# Patient Record
Sex: Female | Born: 1980 | Race: Black or African American | Hispanic: No | Marital: Single | Smoking: Current every day smoker
Health system: Southern US, Community
[De-identification: ages and names within clinical notes are randomized; demographics above are authoritative.]

## PROBLEM LIST (undated history)

## (undated) DIAGNOSIS — D649 Anemia, unspecified: Secondary | ICD-10-CM

## (undated) DIAGNOSIS — O139 Gestational [pregnancy-induced] hypertension without significant proteinuria, unspecified trimester: Secondary | ICD-10-CM

## (undated) HISTORY — DX: Anemia, unspecified: D64.9

---

## 2004-08-09 HISTORY — PX: ANKLE SURGERY: SHX546

## 2004-08-09 HISTORY — PX: OTHER SURGICAL HISTORY: SHX169

## 2005-07-09 ENCOUNTER — Ambulatory Visit: Payer: Self-pay | Admitting: Internal Medicine

## 2005-07-09 ENCOUNTER — Inpatient Hospital Stay (HOSPITAL_COMMUNITY): Admission: EM | Admit: 2005-07-09 | Discharge: 2005-07-14 | Payer: Self-pay | Admitting: Emergency Medicine

## 2005-08-11 ENCOUNTER — Encounter: Admission: RE | Admit: 2005-08-11 | Discharge: 2005-10-15 | Payer: Self-pay | Admitting: Orthopaedic Surgery

## 2009-07-24 ENCOUNTER — Inpatient Hospital Stay (HOSPITAL_COMMUNITY): Admission: AD | Admit: 2009-07-24 | Discharge: 2009-07-28 | Payer: Self-pay | Admitting: Obstetrics and Gynecology

## 2009-08-05 ENCOUNTER — Inpatient Hospital Stay (HOSPITAL_COMMUNITY): Admission: AD | Admit: 2009-08-05 | Discharge: 2009-08-05 | Payer: Self-pay | Admitting: Obstetrics and Gynecology

## 2009-12-26 ENCOUNTER — Encounter: Admission: RE | Admit: 2009-12-26 | Discharge: 2009-12-26 | Payer: Self-pay | Admitting: Obstetrics and Gynecology

## 2010-11-09 LAB — COMPREHENSIVE METABOLIC PANEL
ALT: 32 U/L (ref 0–35)
AST: 17 U/L (ref 0–37)
AST: 19 U/L (ref 0–37)
Albumin: 2.8 g/dL — ABNORMAL LOW (ref 3.5–5.2)
Albumin: 3.1 g/dL — ABNORMAL LOW (ref 3.5–5.2)
Alkaline Phosphatase: 114 U/L (ref 39–117)
Alkaline Phosphatase: 72 U/L (ref 39–117)
BUN: 13 mg/dL (ref 6–23)
CO2: 23 mEq/L (ref 19–32)
CO2: 25 mEq/L (ref 19–32)
Calcium: 9.1 mg/dL (ref 8.4–10.5)
Chloride: 105 mEq/L (ref 96–112)
Chloride: 108 mEq/L (ref 96–112)
Creatinine, Ser: 0.66 mg/dL (ref 0.4–1.2)
Creatinine, Ser: 0.99 mg/dL (ref 0.4–1.2)
GFR calc Af Amer: >60 mL/min (ref >60)
GFR calc Af Amer: >60 mL/min (ref >60)
GFR calc non Af Amer: >60 mL/min (ref >60)
GFR calc non Af Amer: >60 mL/min (ref >60)
Glucose, Bld: 80 mg/dL (ref 70–99)
Potassium: 3.9 mEq/L (ref 3.5–5.1)
Potassium: 4.1 mEq/L (ref 3.5–5.1)
Sodium: 140 mEq/L (ref 135–145)
Total Bilirubin: 0.5 mg/dL (ref 0.3–1.2)
Total Bilirubin: 0.6 mg/dL (ref 0.3–1.2)
Total Protein: 6.4 g/dL (ref 6.0–8.3)

## 2010-11-09 LAB — CBC
HCT: 22.4 % — ABNORMAL LOW (ref 36.0–46.0)
HCT: 25.9 % — ABNORMAL LOW (ref 36.0–46.0)
HCT: 32 % — ABNORMAL LOW (ref 36.0–46.0)
Hemoglobin: 10.8 g/dL — ABNORMAL LOW (ref 12.0–15.0)
Hemoglobin: 7.7 g/dL — ABNORMAL LOW (ref 12.0–15.0)
Hemoglobin: 8.7 g/dL — ABNORMAL LOW (ref 12.0–15.0)
MCHC: 33.4 g/dL (ref 30.0–36.0)
MCHC: 33.9 g/dL (ref 30.0–36.0)
MCHC: 34.2 g/dL (ref 30.0–36.0)
MCV: 91.8 fL (ref 78.0–100.0)
MCV: 91.9 fL (ref 78.0–100.0)
MCV: 93 fL (ref 78.0–100.0)
Platelets: 120 10*3/uL — ABNORMAL LOW (ref 150–400)
Platelets: 178 10*3/uL (ref 150–400)
Platelets: 224 10*3/uL (ref 150–400)
RBC: 2.82 MIL/uL — ABNORMAL LOW (ref 3.87–5.11)
RBC: 3.48 MIL/uL — ABNORMAL LOW (ref 3.87–5.11)
RDW: 14.5 % (ref 11.5–15.5)
RDW: 14.9 % (ref 11.5–15.5)
RDW: 15.2 % (ref 11.5–15.5)
WBC: 7.4 10*3/uL (ref 4.0–10.5)
WBC: 8.3 10*3/uL (ref 4.0–10.5)

## 2010-11-09 LAB — URINALYSIS, ROUTINE W REFLEX MICROSCOPIC
Bilirubin Urine: NEGATIVE
Ketones, ur: NEGATIVE mg/dL
Nitrite: NEGATIVE
Protein, ur: NEGATIVE mg/dL
Specific Gravity, Urine: 1.015 (ref 1.005–1.030)
Urobilinogen, UA: 0.2 mg/dL (ref 0.0–1.0)

## 2010-11-09 LAB — RPR: RPR Ser Ql: NONREACTIVE

## 2010-11-09 LAB — LACTATE DEHYDROGENASE: LDH: 218 U/L (ref 94–250)

## 2010-11-09 LAB — URIC ACID: Uric Acid, Serum: 5.4 mg/dL (ref 2.4–7.0)
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
## 2011-09-12 ENCOUNTER — Encounter (HOSPITAL_COMMUNITY): Payer: Self-pay | Admitting: *Deleted

## 2011-09-12 ENCOUNTER — Emergency Department (HOSPITAL_COMMUNITY)
Admission: EM | Admit: 2011-09-12 | Discharge: 2011-09-12 | Disposition: A | Discharge status: Home / Self Care | Payer: Self-pay | Source: Home / Self Care

## 2011-09-12 DIAGNOSIS — M25519 Pain in unspecified shoulder: Secondary | ICD-10-CM

## 2011-09-12 DIAGNOSIS — M25512 Pain in left shoulder: Secondary | ICD-10-CM

## 2011-09-12 HISTORY — DX: Gestational (pregnancy-induced) hypertension without significant proteinuria, unspecified trimester: O13.9

## 2011-09-12 MED ORDER — IBUPROFEN 800 MG PO TABS: 800.0000 mg | ORAL_TABLET | Freq: Three times a day (TID) | ORAL | Status: AC

## 2011-09-12 MED ORDER — METHOCARBAMOL 750 MG PO TABS: 750.0000 mg | ORAL_TABLET | Freq: Four times a day (QID) | ORAL | Status: AC
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
## 2011-09-12 NOTE — ED Provider Notes (Signed)
Medical screening examination/treatment/procedure(s) were performed by non-physician practitioner and as supervising physician I was immediately available for consultation/collaboration.  Daphna Lafuente   Auston Halfmann, MD 09/12/11 1549 

## 2016-01-16 ENCOUNTER — Telehealth: Payer: Self-pay | Admitting: Obstetrics and Gynecology

## 2016-01-16 NOTE — Telephone Encounter (Signed)
APT. REMINDER CALL, NO ANSWER °

## 2016-01-17 ENCOUNTER — Encounter (HOSPITAL_COMMUNITY): Payer: Self-pay | Admitting: Emergency Medicine

## 2016-01-17 ENCOUNTER — Ambulatory Visit (HOSPITAL_COMMUNITY)
Admission: EM | Admit: 2016-01-17 | Discharge: 2016-01-17 | Disposition: A | Discharge status: Home / Self Care | Payer: Medicaid Other | Attending: Emergency Medicine | Admitting: Emergency Medicine

## 2016-01-17 ENCOUNTER — Ambulatory Visit (INDEPENDENT_AMBULATORY_CARE_PROVIDER_SITE_OTHER): Payer: Medicaid Other

## 2016-01-17 DIAGNOSIS — R0781 Pleurodynia: Secondary | ICD-10-CM

## 2016-01-17 DIAGNOSIS — M542 Cervicalgia: Secondary | ICD-10-CM

## 2016-01-17 DIAGNOSIS — S40022A Contusion of left upper arm, initial encounter: Secondary | ICD-10-CM

## 2016-01-17 MED ORDER — MELOXICAM 15 MG PO TABS: 15.0000 mg | ORAL_TABLET | Freq: Every day | ORAL | Status: DC
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
## 2016-01-19 ENCOUNTER — Encounter: Payer: Self-pay | Admitting: Pulmonary Disease

## 2016-01-19 ENCOUNTER — Ambulatory Visit (INDEPENDENT_AMBULATORY_CARE_PROVIDER_SITE_OTHER): Payer: Medicaid Other | Admitting: Pulmonary Disease

## 2016-01-19 VITALS — BP 109/74 | HR 68 | Temp 98.4°F | Ht 63.0 in | Wt 136.4 lb

## 2016-01-19 DIAGNOSIS — S40022D Contusion of left upper arm, subsequent encounter: Secondary | ICD-10-CM | POA: Diagnosis not present

## 2016-01-19 DIAGNOSIS — S20212D Contusion of left front wall of thorax, subsequent encounter: Secondary | ICD-10-CM

## 2016-01-19 DIAGNOSIS — F17210 Nicotine dependence, cigarettes, uncomplicated: Secondary | ICD-10-CM | POA: Diagnosis not present

## 2016-01-19 DIAGNOSIS — R5382 Chronic fatigue, unspecified: Secondary | ICD-10-CM | POA: Diagnosis not present

## 2016-01-19 DIAGNOSIS — Z111 Encounter for screening for respiratory tuberculosis: Secondary | ICD-10-CM | POA: Diagnosis not present

## 2016-01-19 DIAGNOSIS — Z87828 Personal history of other (healed) physical injury and trauma: Secondary | ICD-10-CM | POA: Insufficient documentation

## 2016-01-19 DIAGNOSIS — Z0000 Encounter for general adult medical examination without abnormal findings: Secondary | ICD-10-CM | POA: Insufficient documentation

## 2016-01-19 DIAGNOSIS — D500 Iron deficiency anemia secondary to blood loss (chronic): Secondary | ICD-10-CM | POA: Diagnosis not present

## 2016-01-19 DIAGNOSIS — N920 Excessive and frequent menstruation with regular cycle: Secondary | ICD-10-CM | POA: Diagnosis not present

## 2016-01-19 DIAGNOSIS — Z720 Tobacco use: Secondary | ICD-10-CM

## 2016-01-19 NOTE — Assessment & Plan Note (Signed)
Fatigue, low energy and cold intolerance. May be related to chronic blood loss and anemia. Has a history of unspecified thyroid problem. Was never on medication for it.  Check TSH

## 2016-01-19 NOTE — Assessment & Plan Note (Signed)
TB skin test (never has had positive before) for pre work evaluation. Had Tdap in 2010 per patient.

## 2016-01-19 NOTE — Patient Instructions (Addendum)
You can try to increase your body's iron by eating the following foods: Red meat, pork and poultry Seafood Beans Dark green leafy vegetables, such as spinach Dried fruit, such as raisins and apricots Iron-fortified cereals, breads and pastas Peas  For pain, try over the counter Tylenol. You can also try ointments like Aspercreme, Bengay or the generic versions of those. You may also try patches such as Salonpas.  Follow up in 3 months for your regular check up.

## 2016-01-19 NOTE — Assessment & Plan Note (Signed)
Regular cycles but with 5 days of heavy bleeding each time. Prior to 2015, her menstrual bleeding was lighter.  Check CBC and ferritin today. Checking TSH  Pap at next visit Consider estrogen-progestin contraceptives (Levonorgestrel/Ethinyl Estradiol, Adolphus BirchwoodKurvelo, Enskyce, Pirmella, Sprintec [facility] 9 [facility]) or the LNg20 (IUD) as first-line therapy

## 2016-01-19 NOTE — Assessment & Plan Note (Addendum)
History of anemia with hgb of 8.7 in 2010. Normocytic at that time. She has heavy menstrual bleeding.  Check CBC Check ferritin List of iron rich foods provided
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
## 2016-01-19 NOTE — Assessment & Plan Note (Signed)
Currently smoking 1/2 ppd. Is not ready to quit.  Available prn for smoking cessation
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
## 2016-01-20 LAB — CBC
HEMATOCRIT: 38.7 % (ref 34.0–46.6)
Hemoglobin: 12.7 g/dL (ref 11.1–15.9)
MCH: 27.5 pg (ref 26.6–33.0)
MCHC: 32.8 g/dL (ref 31.5–35.7)
MCV: 84 fL (ref 79–97)
Platelets: 177 10*3/uL (ref 150–379)
RBC: 4.61 x10E6/uL (ref 3.77–5.28)
RDW: 17.9 % — ABNORMAL HIGH (ref 12.3–15.4)
WBC: 5.3 10*3/uL (ref 3.4–10.8)

## 2016-01-20 LAB — HIV ANTIBODY (ROUTINE TESTING W REFLEX): HIV Screen 4th Generation wRfx: NONREACTIVE

## 2016-01-20 LAB — TSH: TSH: 1.65 u[IU]/mL (ref 0.450–4.500)

## 2016-01-20 LAB — FERRITIN: Ferritin: 27 ng/mL (ref 15–150)

## 2016-01-20 NOTE — Progress Notes (Signed)
Internal Medicine Clinic Attending  Case discussed with Dr. Krall at the time of the visit.  We reviewed the resident's history and exam and pertinent patient test results.  I agree with the assessment, diagnosis, and plan of care documented in the resident's note.  

## 2016-01-22 LAB — TB SKIN TEST
INDURATION: 0 mm
TB SKIN TEST: NEGATIVE

## 2016-02-22 ENCOUNTER — Ambulatory Visit (HOSPITAL_COMMUNITY)
Admission: EM | Admit: 2016-02-22 | Discharge: 2016-02-22 | Disposition: A | Discharge status: Home / Self Care | Payer: Medicaid Other | Attending: Emergency Medicine | Admitting: Emergency Medicine

## 2016-02-22 ENCOUNTER — Ambulatory Visit (INDEPENDENT_AMBULATORY_CARE_PROVIDER_SITE_OTHER): Payer: Medicaid Other

## 2016-02-22 ENCOUNTER — Encounter (HOSPITAL_COMMUNITY): Payer: Self-pay | Admitting: Emergency Medicine

## 2016-02-22 DIAGNOSIS — S8992XA Unspecified injury of left lower leg, initial encounter: Secondary | ICD-10-CM
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
## 2016-02-22 NOTE — ED Notes (Signed)
Reports left knee pain.  Reports yesterday had planted left foot as she was turning to the right.  Patient reports feeling like knee shifted  "out of place".  Pain on the inside of knee.  Patient has been using ice packs, elevation and aleve.

## 2016-02-22 NOTE — Discharge Instructions (Signed)
Your x-ray is normal. I suspect your kneecap temporarily shifted out of place. Continue with elevation and ice. Please take ibuprofen or Aleve regularly for the next few days to help with the swelling. Wearing a knee sleeve for the next week. Use the crutches as needed. If this is not getting better in 1 week, follow-up with sports medicine.

## 2016-09-13 ENCOUNTER — Inpatient Hospital Stay (HOSPITAL_COMMUNITY)
Admission: AD | Admit: 2016-09-13 | Discharge: 2016-09-13 | Disposition: A | Discharge status: Home / Self Care | Payer: Medicaid Other | Source: Ambulatory Visit | Attending: Obstetrics & Gynecology | Admitting: Obstetrics & Gynecology

## 2016-09-13 ENCOUNTER — Ambulatory Visit (HOSPITAL_COMMUNITY)
Admission: EM | Admit: 2016-09-13 | Discharge: 2016-09-13 | Disposition: A | Discharge status: Nursing Home (Includes ICF) | Payer: Medicaid Other | Attending: Internal Medicine | Admitting: Internal Medicine

## 2016-09-13 ENCOUNTER — Encounter (HOSPITAL_COMMUNITY): Payer: Self-pay | Admitting: *Deleted

## 2016-09-13 DIAGNOSIS — N939 Abnormal uterine and vaginal bleeding, unspecified: Secondary | ICD-10-CM

## 2016-09-13 DIAGNOSIS — R102 Pelvic and perineal pain unspecified side: Secondary | ICD-10-CM

## 2016-09-13 DIAGNOSIS — B9689 Other specified bacterial agents as the cause of diseases classified elsewhere: Secondary | ICD-10-CM

## 2016-09-13 DIAGNOSIS — F17210 Nicotine dependence, cigarettes, uncomplicated: Secondary | ICD-10-CM | POA: Insufficient documentation

## 2016-09-13 DIAGNOSIS — Z3202 Encounter for pregnancy test, result negative: Secondary | ICD-10-CM | POA: Insufficient documentation

## 2016-09-13 DIAGNOSIS — Z833 Family history of diabetes mellitus: Secondary | ICD-10-CM | POA: Insufficient documentation

## 2016-09-13 DIAGNOSIS — Z800 Family history of malignant neoplasm of digestive organs: Secondary | ICD-10-CM | POA: Diagnosis not present

## 2016-09-13 DIAGNOSIS — Z8249 Family history of ischemic heart disease and other diseases of the circulatory system: Secondary | ICD-10-CM | POA: Insufficient documentation

## 2016-09-13 DIAGNOSIS — D649 Anemia, unspecified: Secondary | ICD-10-CM | POA: Diagnosis not present

## 2016-09-13 DIAGNOSIS — Z8052 Family history of malignant neoplasm of bladder: Secondary | ICD-10-CM | POA: Insufficient documentation

## 2016-09-13 DIAGNOSIS — Z885 Allergy status to narcotic agent status: Secondary | ICD-10-CM | POA: Insufficient documentation

## 2016-09-13 DIAGNOSIS — N760 Acute vaginitis: Secondary | ICD-10-CM

## 2016-09-13 DIAGNOSIS — Z823 Family history of stroke: Secondary | ICD-10-CM | POA: Diagnosis not present

## 2016-09-13 DIAGNOSIS — N946 Dysmenorrhea, unspecified: Secondary | ICD-10-CM

## 2016-09-13 LAB — URINALYSIS, ROUTINE W REFLEX MICROSCOPIC
BACTERIA UA: NONE SEEN
BILIRUBIN URINE: NEGATIVE
Glucose, UA: NEGATIVE mg/dL
Ketones, ur: NEGATIVE mg/dL
Leukocytes, UA: NEGATIVE
Nitrite: NEGATIVE
PH: 5 (ref 5.0–8.0)
Protein, ur: NEGATIVE mg/dL
RBC / HPF: NONE SEEN RBC/hpf (ref 0–5)
SPECIFIC GRAVITY, URINE: 1.016 (ref 1.005–1.030)
SQUAMOUS EPITHELIAL / LPF: NONE SEEN
WBC, UA: NONE SEEN WBC/hpf (ref 0–5)

## 2016-09-13 LAB — CBC WITH DIFFERENTIAL/PLATELET
BASOS ABS: 0 10*3/uL (ref 0.0–0.1)
Basophils Relative: 1 %
EOS PCT: 1 %
Eosinophils Absolute: 0 10*3/uL (ref 0.0–0.7)
HCT: 32 % — ABNORMAL LOW (ref 36.0–46.0)
HEMOGLOBIN: 11.5 g/dL — AB (ref 12.0–15.0)
LYMPHS PCT: 43 %
Lymphs Abs: 2.7 10*3/uL (ref 0.7–4.0)
MCH: 28.9 pg (ref 26.0–34.0)
MCHC: 35.9 g/dL (ref 30.0–36.0)
MCV: 80.4 fL (ref 78.0–100.0)
Monocytes Absolute: 0.2 10*3/uL (ref 0.1–1.0)
Monocytes Relative: 3 %
NEUTROS ABS: 3.3 10*3/uL (ref 1.7–7.7)
NEUTROS PCT: 52 %
PLATELETS: 156 10*3/uL (ref 150–400)
RBC: 3.98 MIL/uL (ref 3.87–5.11)
RDW: 15.8 % — ABNORMAL HIGH (ref 11.5–15.5)
WBC: 6.3 10*3/uL (ref 4.0–10.5)

## 2016-09-13 LAB — WET PREP, GENITAL
SPERM: NONE SEEN
Trich, Wet Prep: NONE SEEN
YEAST WET PREP: NONE SEEN

## 2016-09-13 LAB — POCT PREGNANCY, URINE
PREG TEST UR: NEGATIVE
PREG TEST UR: NEGATIVE

## 2016-09-13 MED ORDER — KETOROLAC TROMETHAMINE 10 MG PO TABS: 10.0000 mg | ORAL_TABLET | Freq: Four times a day (QID) | ORAL | 0 refills | Status: AC | PRN

## 2016-09-13 MED ORDER — METRONIDAZOLE 500 MG PO TABS: 500.0000 mg | ORAL_TABLET | Freq: Two times a day (BID) | ORAL | 0 refills | Status: AC

## 2016-09-13 MED ORDER — KETOROLAC TROMETHAMINE 60 MG/2ML IM SOLN
60.0000 mg | Freq: Once | INTRAMUSCULAR | Status: AC
  Administered 2016-09-13: 60 mg via INTRAMUSCULAR
  Filled 2016-09-13: qty 2

## 2016-09-13 NOTE — Discharge Instructions (Signed)
Dysmenorrhea °Dysmenorrhea is pain during a menstrual period. You will have pain in the lower belly (abdomen). The pain is caused by the tightening (contracting) of the muscles of the uterus. The pain can be minor or severe. Headache, feeling sick to your stomach (nausea), throwing up (vomiting), or low back pain may occur with this condition. °Follow these instructions at home: °· Only take medicine as told by your doctor. °· Place a heating pad or hot water bottle on your lower back or belly. Do not sleep with a heating pad. °· Exercise may help lessen the pain. °· Massage the lower back or belly. °· Stop smoking. °· Avoid alcohol and caffeine. °Contact a doctor if: °· Your pain does not get better with medicine. °· You have pain during sex. °· Your pain gets worse while taking pain medicine. °· Your period bleeding is heavier than normal. °· You keep feeling sick to your stomach or keep throwing up. °Get help right away if: °· You pass out (faint). °This information is not intended to replace advice given to you by your health care provider. Make sure you discuss any questions you have with your health care provider. °Document Released: 10/22/2008 Document Revised: 01/01/2016 Document Reviewed: 01/11/2013 °Elsevier Interactive Patient Education © 2017 [employer] ° ° °Menorrhagia °Menorrhagia is when your menstrual periods are heavy or last longer than usual. °Follow these instructions at home: °· Only take medicine as told by your doctor. °· Take any iron pills as told by your doctor. Heavy bleeding may cause low levels of iron in your body. °· Do not take aspirin 1 week before or during your period. Aspirin can make the bleeding worse. °· Lie down for a while if you change your tampon or pad more than once in 2 hours. This may help lessen the bleeding. °· Eat a healthy diet and foods with iron. These foods include leafy green vegetables, meat, liver, eggs, and whole grain breads and cereals. °· Do not try to  lose weight. Wait until the heavy bleeding has stopped and your iron level is normal. °Contact a doctor if: °· You soak through a pad or tampon every 1 or 2 hours, and this happens every time you have a period. °· You need to use pads and tampons at the same time because you are bleeding so much. °· You need to change your pad or tampon during the night. °· You have a period that lasts for more than 8 days. °· You pass clots bigger than 1 inch (2.5 cm) wide. °· You have irregular periods that happen more or less often than once a month. °· You feel dizzy or pass out (faint). °· You feel very weak or tired. °· You feel short of breath or feel your heart is beating too fast when you exercise. °· You feel sick to your stomach (nausea) and you throw up (vomit) while you are taking your medicine. °· You have watery poop (diarrhea) while you are taking your medicine. °· You have any problems that may be related to the medicine you are taking. °Get help right away if: °· You soak through 4 or more pads or tampons in 2 hours. °· You have any bleeding while you are pregnant. °This information is not intended to replace advice given to you by your health care provider. Make sure you discuss any questions you have with your health care provider. °Document Released: 05/04/2008 Document Revised: 01/01/2016 Document Reviewed: 01/25/2013 °Elsevier Interactive Patient Education ©   2017 [place].
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
## 2016-09-13 NOTE — MAU Note (Signed)
Excessive bleeding and cramping with her cycle.  Has been putting it off for too long.  Started after she delivered her twins and had her tubal in 2015. The last time she was sexually active, she bled and that had never happened before.

## 2016-09-13 NOTE — Discharge Instructions (Signed)
You have had bleeding for a long time. You also have rather severe pelvic tenderness, pain and cramping. The urgent care does not have the resources for appropriate evaluation and management at this time. It is recommended that you go to the [facility] for evaluation and management. This may include imaging such as ultrasound as well as blood test is not available here.

## 2016-09-13 NOTE — ED Notes (Signed)
Npo
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
## 2016-09-13 NOTE — ED Triage Notes (Signed)
C/o abd bleeding and cramping for two years No pcp or gyn States cramping has gotten worst lately  States she is using more 8 pads daily  Changing pads within two hours daily

## 2016-09-14 LAB — GC/CHLAMYDIA PROBE AMP (~~LOC~~) NOT AT ARMC
Chlamydia: NEGATIVE
Neisseria Gonorrhea: NEGATIVE

## 2016-09-15 ENCOUNTER — Other Ambulatory Visit: Payer: Self-pay | Admitting: Medical

## 2016-09-15 DIAGNOSIS — N939 Abnormal uterine and vaginal bleeding, unspecified: Secondary | ICD-10-CM

## 2016-09-20 ENCOUNTER — Ambulatory Visit (HOSPITAL_COMMUNITY): Payer: Medicaid Other

## 2016-09-20 ENCOUNTER — Ambulatory Visit (HOSPITAL_COMMUNITY)
Admission: RE | Admit: 2016-09-20 | Discharge: 2016-09-20 | Disposition: A | Discharge status: Home / Self Care | Payer: Medicaid Other | Source: Ambulatory Visit | Attending: Medical | Admitting: Medical

## 2016-09-20 DIAGNOSIS — N939 Abnormal uterine and vaginal bleeding, unspecified: Secondary | ICD-10-CM

## 2016-09-20 DIAGNOSIS — Z9851 Tubal ligation status: Secondary | ICD-10-CM | POA: Diagnosis not present

## 2016-10-07 ENCOUNTER — Other Ambulatory Visit (HOSPITAL_COMMUNITY)
Admission: RE | Admit: 2016-10-07 | Discharge: 2016-10-07 | Disposition: A | Discharge status: Home / Self Care | Payer: Medicaid Other | Source: Ambulatory Visit | Attending: Obstetrics & Gynecology | Admitting: Obstetrics & Gynecology

## 2016-10-07 ENCOUNTER — Ambulatory Visit (INDEPENDENT_AMBULATORY_CARE_PROVIDER_SITE_OTHER): Payer: Medicaid Other | Admitting: Obstetrics & Gynecology

## 2016-10-07 ENCOUNTER — Ambulatory Visit (INDEPENDENT_AMBULATORY_CARE_PROVIDER_SITE_OTHER): Payer: Medicaid Other | Admitting: Clinical

## 2016-10-07 ENCOUNTER — Encounter: Payer: Self-pay | Admitting: Obstetrics & Gynecology

## 2016-10-07 VITALS — BP 117/70 | HR 70 | Ht 63.0 in | Wt 132.9 lb

## 2016-10-07 DIAGNOSIS — N938 Other specified abnormal uterine and vaginal bleeding: Secondary | ICD-10-CM

## 2016-10-07 DIAGNOSIS — R4589 Other symptoms and signs involving emotional state: Secondary | ICD-10-CM

## 2016-10-07 DIAGNOSIS — Z124 Encounter for screening for malignant neoplasm of cervix: Secondary | ICD-10-CM | POA: Diagnosis not present

## 2016-10-07 DIAGNOSIS — Z1151 Encounter for screening for human papillomavirus (HPV): Secondary | ICD-10-CM | POA: Diagnosis not present

## 2016-10-07 DIAGNOSIS — Z113 Encounter for screening for infections with a predominantly sexual mode of transmission: Secondary | ICD-10-CM | POA: Insufficient documentation

## 2016-10-07 DIAGNOSIS — F4323 Adjustment disorder with mixed anxiety and depressed mood: Secondary | ICD-10-CM

## 2016-10-07 DIAGNOSIS — Z01419 Encounter for gynecological examination (general) (routine) without abnormal findings: Secondary | ICD-10-CM | POA: Diagnosis present

## 2016-10-07 DIAGNOSIS — Z0000 Encounter for general adult medical examination without abnormal findings: Secondary | ICD-10-CM

## 2016-10-07 LAB — POCT PREGNANCY, URINE: Preg Test, Ur: NEGATIVE

## 2016-10-07 NOTE — Progress Notes (Signed)
   Subjective:    Patient ID: Janan Salvo, female    DOB: 08/18/1980, 36 y.o.   MRN: [id number]  HPI  36 yo AA P4 here for follow up after a MAU visit for DUB. Her u/s was normal and her hbg was 11.5.  Review of Systems She has had a BTL.    Objective:   Physical Exam Quiet AA woman, NAD  UPT negative, consent signed, time out done Cervix prepped with betadine and grasped with a single tooth tenaculum Uterus sounded to 9 cm Pipelle used for 1 passes with a moderate amount of dark brown what appeared to be blood obtained. She scooted up in the bed and said that she does not handle pain well. She would not have a second pass with the pipelle. She was fine after the procedure.   She will see Asher MuirJamie today.      Assessment & Plan:  DUB with normal u/s RTC 2 weeks for results and treatment plan Preventative care- pap
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
## 2016-10-08 LAB — CYTOLOGY - PAP
Adequacy: ABSENT
CHLAMYDIA, DNA PROBE: NEGATIVE
Diagnosis: NEGATIVE
HPV (WINDOPATH): NOT DETECTED
Neisseria Gonorrhea: NEGATIVE

## 2016-11-03 ENCOUNTER — Ambulatory Visit (INDEPENDENT_AMBULATORY_CARE_PROVIDER_SITE_OTHER): Payer: Medicaid Other | Admitting: Obstetrics & Gynecology

## 2016-11-03 ENCOUNTER — Encounter: Payer: Self-pay | Admitting: Obstetrics & Gynecology

## 2016-11-03 VITALS — BP 107/79 | HR 81 | Wt 132.8 lb

## 2016-11-03 DIAGNOSIS — G8929 Other chronic pain: Secondary | ICD-10-CM | POA: Diagnosis not present

## 2016-11-03 DIAGNOSIS — R102 Pelvic and perineal pain: Secondary | ICD-10-CM | POA: Diagnosis present

## 2016-11-03 DIAGNOSIS — N938 Other specified abnormal uterine and vaginal bleeding: Secondary | ICD-10-CM | POA: Diagnosis not present

## 2016-11-03 NOTE — Progress Notes (Signed)
   Subjective:    Patient ID: [name], female    DOB: 11/27/1980, 36 y.o.   MRN: [id number]  HPI  36 yo P4 here today for follow up of her lab results/EMBX. She had these as part of a work up for DUB. She tells me that while the bleeding is very frustrating and she is slightly anemic, her main concern is her pelvic pain which she has almost every day. She says that no pain meds she has taken helps this pain.  Review of Systems She has had 4 cesarean sections She has had a BTL    Objective:   Physical Exam [id number] Breathing, conversing, and ambulating normally     Assessment & Plan:  DUB, CPP- I offered her every treatment that I could think of including OCPs, IUD, d&c with ablation, even LAVH. She found something undesireable about every option. At this time she will consider her options (all written down for her) and RTC prn.

## 2016-12-06 ENCOUNTER — Telehealth: Payer: Self-pay | Admitting: *Deleted

## 2016-12-06 NOTE — Telephone Encounter (Signed)
Pt left message for Dr Ellin Saba nurse. Was told to call and let us know when she made a decision. However has a question about a possible alternative to surgery. Please return call.

## 2016-12-06 NOTE — Telephone Encounter (Signed)
Returned patient's call. Patient stated that she had been talking to Dr Marice Potter about alternatives to surgery and Dr Marice Potter had told her the birth control pil or IUD were options. Patient would like to know if depo was an option. I affirmed to her that depo could indeed be an option. Patient stated that she would call to set up an appointment for depo.

## 2016-12-07 ENCOUNTER — Ambulatory Visit (INDEPENDENT_AMBULATORY_CARE_PROVIDER_SITE_OTHER): Payer: Medicaid Other | Admitting: *Deleted

## 2016-12-07 VITALS — BP 123/74 | HR 69 | Wt 131.6 lb

## 2016-12-07 DIAGNOSIS — N938 Other specified abnormal uterine and vaginal bleeding: Secondary | ICD-10-CM

## 2016-12-07 DIAGNOSIS — G8929 Other chronic pain: Secondary | ICD-10-CM

## 2016-12-07 DIAGNOSIS — Z30013 Encounter for initial prescription of injectable contraceptive: Secondary | ICD-10-CM | POA: Diagnosis not present

## 2016-12-07 DIAGNOSIS — R102 Pelvic and perineal pain: Secondary | ICD-10-CM

## 2016-12-07 IMAGING — CR DG KNEE COMPLETE 4+V*L*
4 series · 4 of 4 positions shown · non-contrast
Comparison: 07/12/2005

CLINICAL DATA: Patient fell 1 day ago.  Knee pain.

EXAM:
LEFT KNEE - COMPLETE 4+ VIEW

[knee ap]
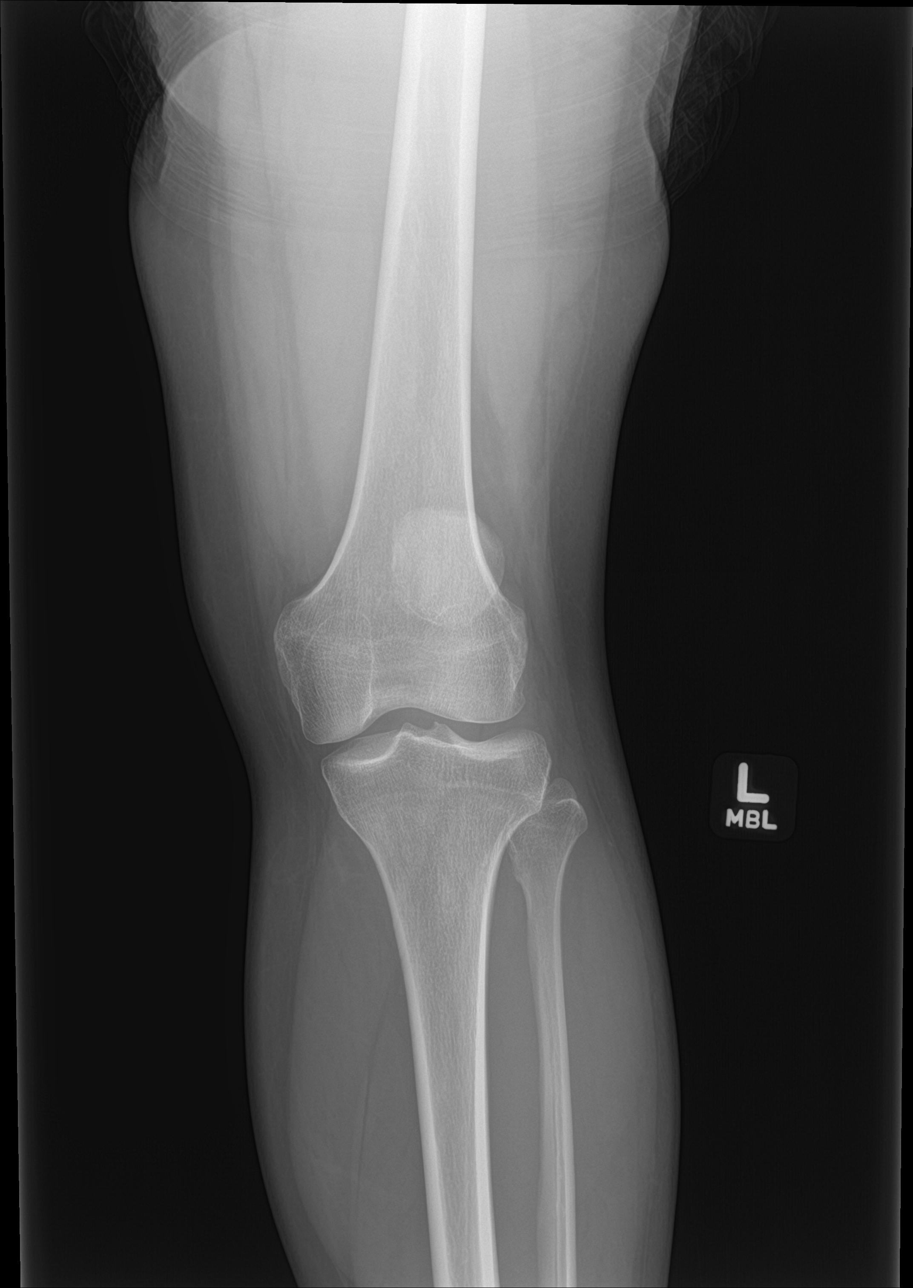

[knee lat]
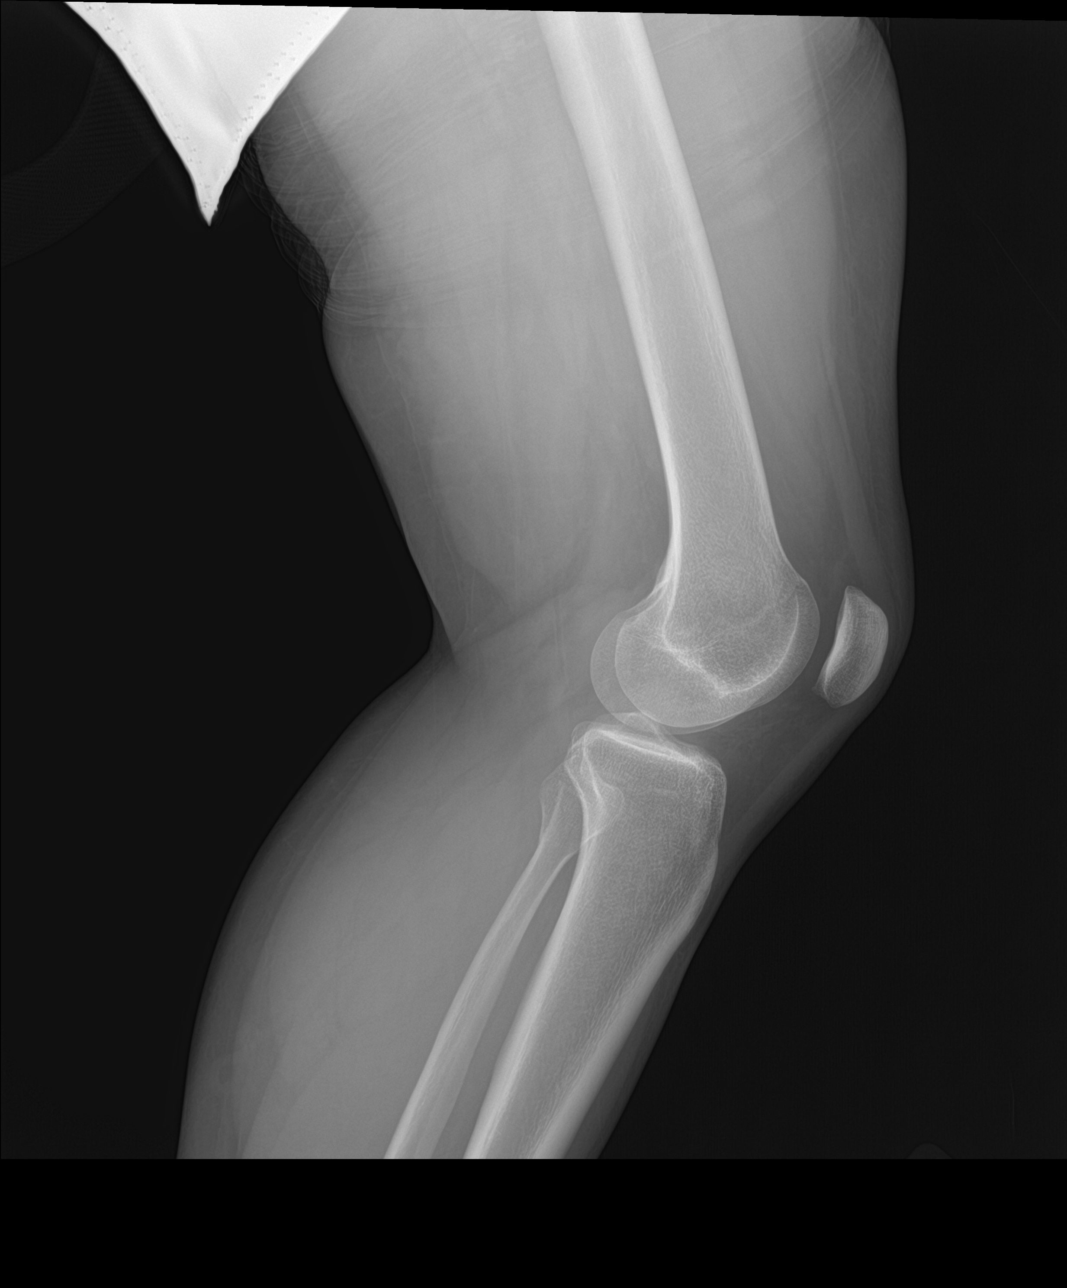

[knee obl (1 of 2)]
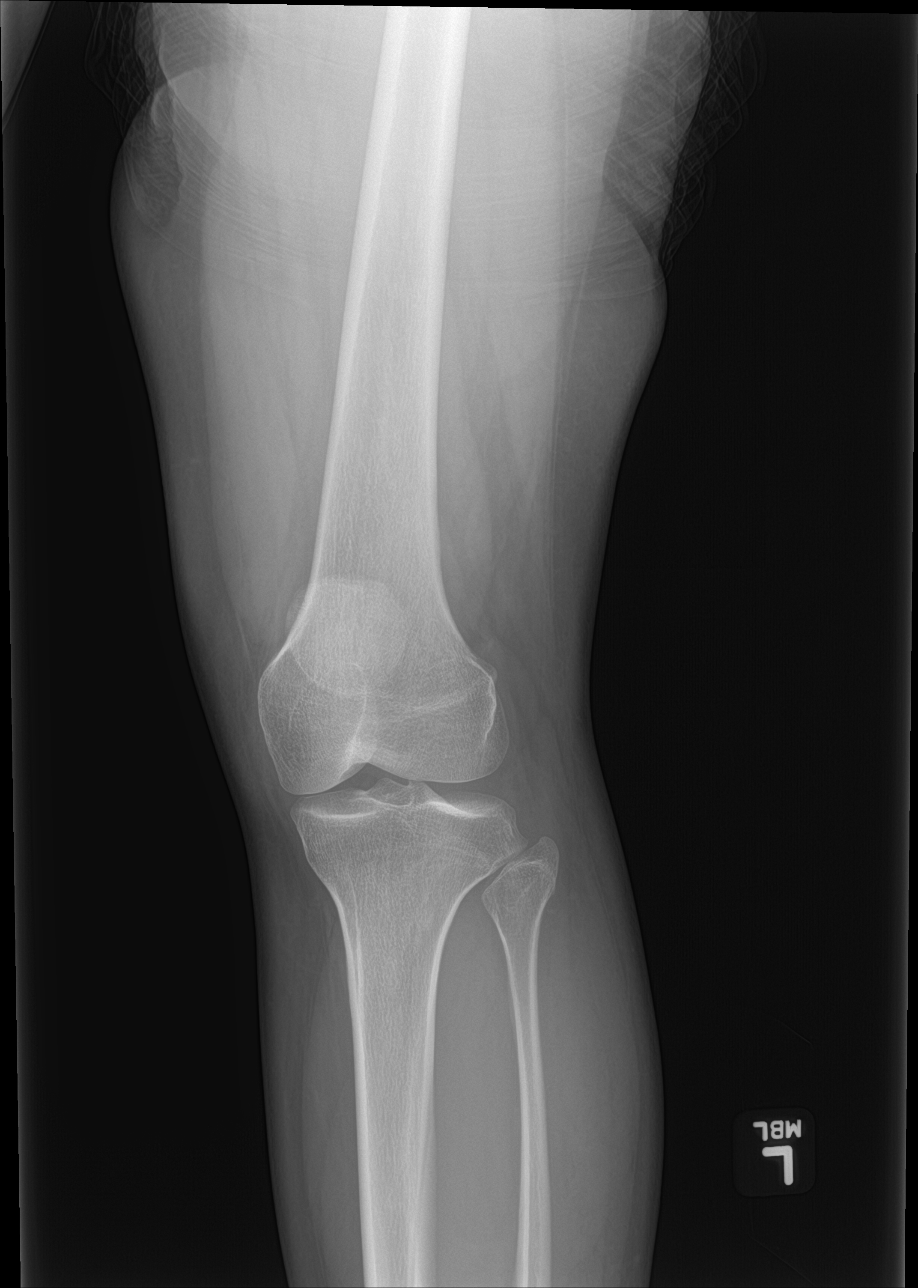

[knee obl (2 of 2)]
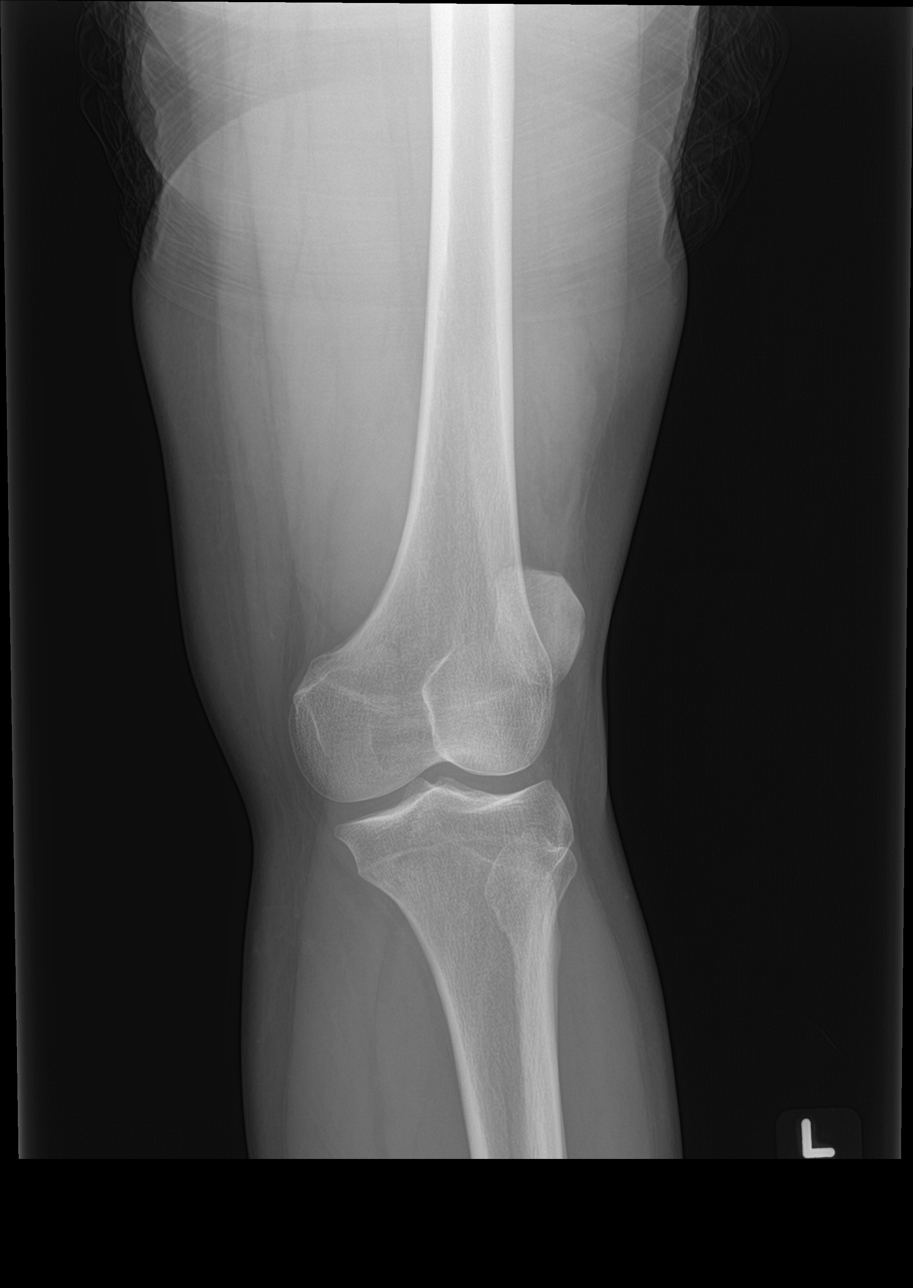

[4 of 4 positions shown; findings below may reference images not displayed]

FINDINGS: No evidence of fracture, dislocation, or joint effusion. No evidence
of arthropathy or other focal bone abnormality. Soft tissues are
unremarkable.
IMPRESSION: Negative.

## 2016-12-07 MED ORDER — MEDROXYPROGESTERONE ACETATE 150 MG/ML IM SUSP
150.0000 mg | Freq: Once | INTRAMUSCULAR | Status: AC
  Administered 2016-12-07: 150 mg via INTRAMUSCULAR

## 2016-12-07 NOTE — Progress Notes (Signed)
Pt presented for appt to receive Depo Provera as scheduled. Dr. Marice Potter was called and stated that Depo Provera is an option for treatment of her DUB - pt may have injection today and every 3 months - order received. Pt advised of potential side effects including temporary heavier bleeding which may last up to 6 months. She agrees to treatment regimen. Injection administered and pt tolerated well. Next due 7/17-7/31.

## 2017-02-22 ENCOUNTER — Ambulatory Visit (INDEPENDENT_AMBULATORY_CARE_PROVIDER_SITE_OTHER): Payer: Medicaid Other | Admitting: General Practice

## 2017-02-22 VITALS — BP 127/81 | HR 64

## 2017-02-22 DIAGNOSIS — Z3042 Encounter for surveillance of injectable contraceptive: Secondary | ICD-10-CM

## 2017-02-22 DIAGNOSIS — N938 Other specified abnormal uterine and vaginal bleeding: Secondary | ICD-10-CM

## 2017-02-22 MED ORDER — MEDROXYPROGESTERONE ACETATE 150 MG/ML IM SUSP
150.0000 mg | Freq: Once | INTRAMUSCULAR | Status: AC
Start: 2017-02-22 — End: 2017-02-22
  Administered 2017-02-22: 150 mg via INTRAMUSCULAR

## 2017-02-22 NOTE — Progress Notes (Addendum)
Patient presented to office for Depo injection. Depo injection was given in left upper outer quadrant. Patient tolerated well.

## 2017-05-10 ENCOUNTER — Ambulatory Visit: Payer: Self-pay

## 2024-07-09 ENCOUNTER — Emergency Department (HOSPITAL_BASED_OUTPATIENT_CLINIC_OR_DEPARTMENT_OTHER): Payer: Self-pay

## 2024-07-09 ENCOUNTER — Other Ambulatory Visit: Payer: Self-pay

## 2024-07-09 ENCOUNTER — Emergency Department (HOSPITAL_BASED_OUTPATIENT_CLINIC_OR_DEPARTMENT_OTHER)
Admission: EM | Admit: 2024-07-09 | Discharge: 2024-07-09 | Disposition: A | Discharge status: Home / Self Care | Payer: Self-pay | Attending: Emergency Medicine | Admitting: Emergency Medicine

## 2024-07-09 ENCOUNTER — Other Ambulatory Visit (HOSPITAL_BASED_OUTPATIENT_CLINIC_OR_DEPARTMENT_OTHER): Payer: Self-pay

## 2024-07-09 ENCOUNTER — Encounter (HOSPITAL_BASED_OUTPATIENT_CLINIC_OR_DEPARTMENT_OTHER): Payer: Self-pay

## 2024-07-09 DIAGNOSIS — R1031 Right lower quadrant pain: Secondary | ICD-10-CM

## 2024-07-09 LAB — CBC
HCT: 31.5 % — ABNORMAL LOW (ref 36.0–46.0)
Hemoglobin: 11.3 g/dL — ABNORMAL LOW (ref 12.0–15.0)
MCH: 30.2 pg (ref 26.0–34.0)
MCHC: 35.9 g/dL (ref 30.0–36.0)
MCV: 84.2 fL (ref 80.0–100.0)
Platelets: 143 K/uL — ABNORMAL LOW (ref 150–400)
RBC: 3.74 MIL/uL — ABNORMAL LOW (ref 3.87–5.11)
RDW: 13.7 % (ref 11.5–15.5)
WBC: 4.3 K/uL (ref 4.0–10.5)
nRBC: 0 % (ref 0.0–0.2)

## 2024-07-09 LAB — PREGNANCY, URINE: Preg Test, Ur: NEGATIVE

## 2024-07-09 LAB — URINALYSIS, ROUTINE W REFLEX MICROSCOPIC
Bilirubin Urine: NEGATIVE
Glucose, UA: NEGATIVE mg/dL
Ketones, ur: NEGATIVE mg/dL
Nitrite: NEGATIVE
Specific Gravity, Urine: 1.024 (ref 1.005–1.030)
pH: 6.5 (ref 5.0–8.0)

## 2024-07-09 LAB — COMPREHENSIVE METABOLIC PANEL WITH GFR
ALT: 16 U/L (ref 0–44)
AST: 22 U/L (ref 15–41)
Albumin: 4.1 g/dL (ref 3.5–5.0)
Alkaline Phosphatase: 49 U/L (ref 38–126)
Anion gap: 12 (ref 5–15)
BUN: 11 mg/dL (ref 6–20)
CO2: 21 mmol/L — ABNORMAL LOW (ref 22–32)
Calcium: 9.4 mg/dL (ref 8.9–10.3)
Chloride: 105 mmol/L (ref 98–111)
Creatinine, Ser: 0.71 mg/dL (ref 0.44–1.00)
GFR, Estimated: >60 mL/min (ref >60)
Glucose, Bld: 93 mg/dL (ref 70–99)
Potassium: 4 mmol/L (ref 3.5–5.1)
Sodium: 138 mmol/L (ref 135–145)
Total Bilirubin: 0.6 mg/dL (ref 0.0–1.2)
Total Protein: 7.8 g/dL (ref 6.5–8.1)

## 2024-07-09 LAB — LIPASE, BLOOD: Lipase: 13 U/L (ref 11–51)

## 2024-07-09 MED ORDER — LACTATED RINGERS IV BOLUS
1000.0000 mL | Freq: Once | INTRAVENOUS | Status: AC
  Administered 2024-07-09: 1000 mL via INTRAVENOUS

## 2024-07-09 MED ORDER — NAPROXEN 500 MG PO TABS
500.0000 mg | ORAL_TABLET | Freq: Two times a day (BID) | ORAL | 0 refills | Status: AC
  Filled 2024-07-09: qty 10, 5d supply, fill #0

## 2024-07-09 MED ORDER — ONDANSETRON 4 MG PO TBDP
4.0000 mg | ORAL_TABLET | Freq: Three times a day (TID) | ORAL | 0 refills | Status: AC | PRN
  Filled 2024-07-09: qty 20, 7d supply, fill #0

## 2024-07-09 MED ORDER — DICYCLOMINE HCL 20 MG PO TABS
20.0000 mg | ORAL_TABLET | Freq: Two times a day (BID) | ORAL | 0 refills | Status: AC
  Filled 2024-07-09: qty 20, 10d supply, fill #0

## 2024-07-09 MED ORDER — ONDANSETRON HCL 4 MG/2ML IJ SOLN
4.0000 mg | Freq: Once | INTRAMUSCULAR | Status: AC
  Administered 2024-07-09: 4 mg via INTRAVENOUS
  Filled 2024-07-09: qty 2

## 2024-07-09 MED ORDER — MORPHINE SULFATE (PF) 4 MG/ML IV SOLN
4.0000 mg | Freq: Once | INTRAVENOUS | Status: AC
  Administered 2024-07-09: 4 mg via INTRAVENOUS
  Filled 2024-07-09: qty 1

## 2024-07-09 MED ORDER — IOHEXOL 300 MG/ML  SOLN
100.0000 mL | Freq: Once | INTRAMUSCULAR | Status: AC | PRN
  Administered 2024-07-09: 100 mL via INTRAVENOUS

## 2024-07-09 MED ORDER — KETOROLAC TROMETHAMINE 15 MG/ML IJ SOLN
15.0000 mg | Freq: Once | INTRAMUSCULAR | Status: AC
  Administered 2024-07-09: 15 mg via INTRAVENOUS
  Filled 2024-07-09: qty 1

## 2024-07-09 NOTE — Discharge Instructions (Addendum)
 [name]  Thank you for allowing us  to take care of you today.  You came to the Emergency Department today because you you are having pain in the right side of your abdomen.  We got a CT and labs which were reassuring.,  And we also got a ultrasound to check your ovaries, and your pain is not due to any emergency process that we are able to see on the CT, labs, or ultrasound.  Given this, and given the similarity of your pain to your prior period cramps, and the fact that your pain improved with the Toradol , your pain is most likely due to menstrual cramps.  We will prescribe you Bentyl  and Zofran  for abdominal cramps and nausea/vomiting respectively, additionally we will naproxen  that you can take twice a day for the next 5 days, this is an NSAID that will help with your period cramps.  Do not take any other NSAIDs (ibuprofen , Motrin , Advil , Aleve  while you are on this medication  To-Do: 1. Please follow-up with your primary doctor within 1 - 2 weeks / as soon as possible.   Please return to the Emergency Department or call 911 if you experience have worsening of your symptoms, or do not get better, chest pain, shortness of breath, severe or significantly worsening pain, high fever, severe confusion, pass out or have any reason to think that you need emergency medical care.   We hope you feel better soon.   Mitzie Later, MD Department of Emergency Medicine [facility]

## 2024-07-09 NOTE — ED Notes (Signed)
 Reviewed AVS/discharge instructions with patient. Time allotted for and all questions answered. Patient is agreeable for d/c and escorted to ED exit by staff.

## 2024-07-09 NOTE — ED Triage Notes (Signed)
 Right sided abd and lower back pain. Nauseated due to pain. Denies vomiting or diarrhea. LMP 07/07/24. Denies dysuria.

## 2024-07-09 NOTE — ED Provider Notes (Signed)
  EMERGENCY DEPARTMENT AT [facility] Provider Note   CSN: [id number] Arrival date & time: 07/09/24  9177     History Chief Complaint  Patient presents with   Abdominal Pain    HPI: [name] is a 43 y.o. female with history pertinent for abnormal uterine bleeding, iron deficiency anemia, tobacco use disorder who presents complaining of abdominal pain. Patient arrived via POV.  History provided by patient.  No interpreter required during this encounter.  Patient presents for right sided abdominal pain.  Reports that the pain is constant, crampy, and radiates to her right sided back.  Reports that she started her LMP 3 days ago, and she has previously had similar quality of pain with her menstrual cycle, however this is more severe than usual.  Reports that pain began last night, and is right-sided, associated with nausea, however no vomiting, diarrhea, chest pain, shortness of breath, fevers, chills.  Reports urinary frequency and urgency without dysuria, is unable to state whether or not she has hematuria or mixing of menstrual blood with urine.  Reports a remote history of kidney stone when she was 14, however states that this feels dissimilar.  Denies possibility of pregnancy.  Patient's recorded medical, surgical, social, medication list and allergies were reviewed in the Snapshot window as part of the initial history.   Prior to Admission medications   Medication Sig Start Date End Date Taking? Authorizing Provider  ketorolac  (TORADOL ) 10 MG tablet Take 1 tablet (10 mg total) by mouth every 6 (six) hours as needed. Patient not taking: Reported on 10/07/2016 09/13/16   Wenzel, Julie N, PA-C  metroNIDAZOLE  (FLAGYL ) 500 MG tablet Take 1 tablet (500 mg total) by mouth 2 (two) times daily. Patient not taking: Reported on 10/07/2016 09/13/16   Larwence Mliss SAILOR, PA-C     Allergies: Codeine   Review of Systems   ROS as per HPI  Physical Exam Updated Vital Signs BP 120/79  (BP Location: Left Arm)   Pulse 90   Temp 98.1 F (36.7 C) (Oral)   Resp 16   Ht 5' 3 (1.6 m)   Wt 63.5 kg   LMP 07/07/2024 (Exact Date)   SpO2 100%   BMI 24.80 kg/m  Physical Exam Vitals and nursing note reviewed.  Constitutional:      General: She is not in acute distress.    Appearance: Normal appearance. She is well-developed.  HENT:     Head: Normocephalic and atraumatic.  Eyes:     Extraocular Movements: Extraocular movements intact.     Conjunctiva/sclera: Conjunctivae normal.  Cardiovascular:     Rate and Rhythm: Normal rate and regular rhythm.     Pulses: Normal pulses.     Heart sounds: No murmur heard. Pulmonary:     Effort: Pulmonary effort is normal. No respiratory distress.     Breath sounds: Normal breath sounds.  Abdominal:     Palpations: Abdomen is soft.     Tenderness: There is abdominal tenderness in the right upper quadrant, right lower quadrant, epigastric area and suprapubic area. There is no right CVA tenderness or left CVA tenderness. Positive signs include Murphy's sign and McBurney's sign. Negative signs include Rovsing's sign, psoas sign and obturator sign.  Musculoskeletal:        General: No swelling.     Cervical back: Neck supple.  Skin:    General: Skin is warm and dry.     Capillary Refill: Capillary refill takes less than 2 seconds.  Neurological:  General: No focal deficit present.     Mental Status: She is alert and oriented to person, place, and time.  Psychiatric:        Mood and Affect: Mood normal.     ED Course/ Medical Decision Making/ A&P    Procedures Procedures   Medications Ordered in ED Medications - No data to display  Medical Decision Making:   [name] is a 43 y.o. female who presents for right-sided abdominal pain as per above.  Physical exam is pertinent for positive right-sided abdominal tenderness as well as epigastric and suprapubic tenderness, positive Murphy's and McBurney sign.  The  differential includes but is not limited to ovarian pathology, ovarian cyst, torsion, gynecologic infection, appendicitis, bowel obstruction, ileitis, gastroenteritis, cholecystitis, biliary colic, menstrual cramps, pancreatitis, nephrolithiasis, pyelonephritis, cystitis.  Independent historian: None  External data reviewed: {LSEXTERNALDATA:33407}  Initial Plan:  Screening labs including CBC and Metabolic panel to evaluate for infectious or metabolic etiology of disease.  Screening urine pregnancy test for pregnancy Screening lipase for pancreatitis Urinalysis with reflex culture ordered to evaluate for UTI or relevant urologic/nephrologic pathology.  CT abdomen pelvis to evaluate for structural/infectious intra-abdominal pathology.  Objective evaluation as below reviewed   Labs: Ordered, Independent interpretation, and Details: CBC without leukocytosis, similar anemia on comparison to prior.  No absolute thrombocytopenia, however overall value is similar in comparison to prior.  Urine pregnancy negative.  UA ***.  Lipase ***.  CMP ***.  Radiology: Ordered, Independent interpretation, Details: ***, and All images reviewed independently. ***Agree with radiology report at this time.   No results found.  EKG/Medicine tests: Not indicated EKG Interpretation:                  Interventions: Morphine , LR bolus, Zofran , Toradol   See the EMR for full details regarding lab and imaging results.  Patient presents to the emergency department for right-sided crampy abdominal pain in the setting of menstrual cycle.  Patient with diffuse right-sided abdominal pain.  Do feel that patient warrants screening labs, imaging, symptomatic treatment as per above.  Afebrile, hemodynamically stable, no leukocytosis, thus infectious intra-abdominal process less likely.  Patient does have negative urine pregnancy test, therefore will include Toradol  and initial treatment of abdominal pain in case pain is gynecologic  in pathology.  UA does not demonstrate convincing UTI, in the absence of dysuria, will not treat empirically, however will send urine culture.  {LSCOPA:33420}  Discussion of management or test interpretations with external provider(s): ***  Risk Drugs:{LSDRUGS:33399} Treatment: {LSTREATMENT:33409} Surgery:{LSSURGERY:33410} Critical Care: ***  Disposition: {LSDISPO:33388}  MDM generated using voice dictation software and may contain dictation errors.  Please contact me for any clarification or with any questions.  Clinical Impression: No diagnosis found.   Data Unavailable   Final Clinical Impression(s) / ED Diagnoses Final diagnoses:  None    Rx / DC Orders ED Discharge Orders     None

## 2024-07-10 LAB — URINE CULTURE
